# Patient Record
Sex: Female | Born: 1955 | Race: White | Hispanic: No | Marital: Married | Smoking: Never smoker
Health system: Southern US, Community
[De-identification: ages and names within clinical notes are randomized; demographics above are authoritative.]

## PROBLEM LIST (undated history)

## (undated) DIAGNOSIS — E119 Type 2 diabetes mellitus without complications: Secondary | ICD-10-CM

## (undated) DIAGNOSIS — Z973 Presence of spectacles and contact lenses: Secondary | ICD-10-CM

## (undated) DIAGNOSIS — C4490 Unspecified malignant neoplasm of skin, unspecified: Secondary | ICD-10-CM

## (undated) DIAGNOSIS — E7800 Pure hypercholesterolemia, unspecified: Secondary | ICD-10-CM

## (undated) DIAGNOSIS — I10 Essential (primary) hypertension: Secondary | ICD-10-CM

## (undated) DIAGNOSIS — M1990 Unspecified osteoarthritis, unspecified site: Secondary | ICD-10-CM

## (undated) HISTORY — DX: Unspecified malignant neoplasm of skin, unspecified: C44.90

## (undated) HISTORY — PX: BREAST BIOPSY: SHX20

## (undated) HISTORY — PX: ABDOMINAL HYSTERECTOMY: SHX81

## (undated) HISTORY — PX: LAPAROSCOPY: SHX197

## (undated) HISTORY — PX: WISDOM TOOTH EXTRACTION: SHX21

## (undated) SURGERY — Surgical Case
Anesthesia: *Unknown

---

## 1996-07-31 HISTORY — PX: FLEXIBLE SIGMOIDOSCOPY: SHX1649

## 2004-05-01 HISTORY — PX: APPENDECTOMY: SHX54

## 2004-09-16 ENCOUNTER — Ambulatory Visit: Payer: Self-pay | Admitting: Unknown Physician Specialty

## 2005-09-18 ENCOUNTER — Ambulatory Visit: Payer: Self-pay | Admitting: Unknown Physician Specialty

## 2006-03-15 ENCOUNTER — Other Ambulatory Visit: Payer: Self-pay

## 2006-03-30 ENCOUNTER — Inpatient Hospital Stay: Payer: Self-pay | Admitting: Unknown Physician Specialty

## 2006-07-02 ENCOUNTER — Ambulatory Visit: Payer: Self-pay | Admitting: Internal Medicine

## 2006-07-08 ENCOUNTER — Ambulatory Visit: Payer: Self-pay | Admitting: Internal Medicine

## 2006-09-02 ENCOUNTER — Ambulatory Visit: Payer: Self-pay | Admitting: Internal Medicine

## 2006-09-20 ENCOUNTER — Ambulatory Visit: Payer: Self-pay | Admitting: Unknown Physician Specialty

## 2007-09-27 ENCOUNTER — Ambulatory Visit: Payer: Self-pay | Admitting: Unknown Physician Specialty

## 2007-10-31 ENCOUNTER — Ambulatory Visit: Payer: Self-pay | Admitting: Unknown Physician Specialty

## 2007-11-28 ENCOUNTER — Ambulatory Visit: Payer: Self-pay | Admitting: General Surgery

## 2007-11-28 HISTORY — PX: COLONOSCOPY: SHX174

## 2008-01-30 ENCOUNTER — Inpatient Hospital Stay: Payer: Self-pay | Admitting: General Surgery

## 2008-01-30 HISTORY — PX: HERNIA REPAIR: SHX51

## 2008-01-30 HISTORY — PX: CHOLECYSTECTOMY: SHX55

## 2008-02-02 ENCOUNTER — Emergency Department: Payer: Self-pay | Admitting: Internal Medicine

## 2008-10-09 ENCOUNTER — Ambulatory Visit: Payer: Self-pay | Admitting: Unknown Physician Specialty

## 2009-10-10 ENCOUNTER — Ambulatory Visit: Payer: Self-pay | Admitting: Unknown Physician Specialty

## 2010-10-20 ENCOUNTER — Ambulatory Visit: Payer: Self-pay | Admitting: Unknown Physician Specialty

## 2011-11-04 ENCOUNTER — Ambulatory Visit: Payer: Self-pay | Admitting: Obstetrics and Gynecology

## 2011-11-18 ENCOUNTER — Ambulatory Visit: Payer: Self-pay | Admitting: Obstetrics and Gynecology

## 2014-12-19 ENCOUNTER — Ambulatory Visit: Payer: Self-pay

## 2015-12-27 ENCOUNTER — Other Ambulatory Visit: Payer: Self-pay | Admitting: Obstetrics & Gynecology

## 2015-12-27 DIAGNOSIS — Z1239 Encounter for other screening for malignant neoplasm of breast: Secondary | ICD-10-CM

## 2016-01-07 ENCOUNTER — Ambulatory Visit: Payer: Self-pay

## 2016-01-14 ENCOUNTER — Ambulatory Visit
Admission: RE | Admit: 2016-01-14 | Discharge: 2016-01-14 | Disposition: A | Discharge status: Home / Self Care | Payer: BC Managed Care – PPO | Source: Ambulatory Visit | Attending: Obstetrics & Gynecology | Admitting: Obstetrics & Gynecology

## 2016-01-14 ENCOUNTER — Other Ambulatory Visit: Payer: Self-pay | Admitting: Obstetrics & Gynecology

## 2016-01-14 DIAGNOSIS — Z1239 Encounter for other screening for malignant neoplasm of breast: Secondary | ICD-10-CM

## 2016-01-14 DIAGNOSIS — Z1231 Encounter for screening mammogram for malignant neoplasm of breast: Secondary | ICD-10-CM | POA: Diagnosis present

## 2016-10-15 ENCOUNTER — Encounter: Payer: Self-pay | Admitting: *Deleted

## 2016-10-21 ENCOUNTER — Ambulatory Visit
Admission: RE | Admit: 2016-10-21 | Discharge: 2016-10-21 | Disposition: A | Discharge status: Home / Self Care | Payer: BC Managed Care – PPO | Source: Ambulatory Visit | Attending: Podiatry | Admitting: Podiatry

## 2016-10-21 ENCOUNTER — Ambulatory Visit: Payer: BC Managed Care – PPO | Admitting: Anesthesiology

## 2016-10-21 ENCOUNTER — Encounter
Admission: RE | Disposition: A | Discharge status: Home / Self Care | Payer: Self-pay | Source: Ambulatory Visit | Attending: Podiatry

## 2016-10-21 DIAGNOSIS — Z79899 Other long term (current) drug therapy: Secondary | ICD-10-CM | POA: Diagnosis not present

## 2016-10-21 DIAGNOSIS — E119 Type 2 diabetes mellitus without complications: Secondary | ICD-10-CM | POA: Insufficient documentation

## 2016-10-21 DIAGNOSIS — Z7982 Long term (current) use of aspirin: Secondary | ICD-10-CM | POA: Insufficient documentation

## 2016-10-21 DIAGNOSIS — D489 Neoplasm of uncertain behavior, unspecified: Secondary | ICD-10-CM | POA: Diagnosis present

## 2016-10-21 DIAGNOSIS — Z7984 Long term (current) use of oral hypoglycemic drugs: Secondary | ICD-10-CM | POA: Diagnosis not present

## 2016-10-21 DIAGNOSIS — M7989 Other specified soft tissue disorders: Secondary | ICD-10-CM | POA: Insufficient documentation

## 2016-10-21 DIAGNOSIS — I10 Essential (primary) hypertension: Secondary | ICD-10-CM | POA: Insufficient documentation

## 2016-10-21 DIAGNOSIS — L03031 Cellulitis of right toe: Secondary | ICD-10-CM | POA: Diagnosis not present

## 2016-10-21 DIAGNOSIS — M898X7 Other specified disorders of bone, ankle and foot: Secondary | ICD-10-CM | POA: Diagnosis not present

## 2016-10-21 HISTORY — PX: MASS EXCISION: SHX2000

## 2016-10-21 HISTORY — DX: Presence of spectacles and contact lenses: Z97.3

## 2016-10-21 HISTORY — PX: EXCISION PARTIAL PHALANX: SHX6617

## 2016-10-21 HISTORY — DX: Essential (primary) hypertension: I10

## 2016-10-21 HISTORY — DX: Pure hypercholesterolemia, unspecified: E78.00

## 2016-10-21 HISTORY — DX: Unspecified osteoarthritis, unspecified site: M19.90

## 2016-10-21 HISTORY — DX: Type 2 diabetes mellitus without complications: E11.9

## 2016-10-21 LAB — GLUCOSE, CAPILLARY
Glucose-Capillary: 114 mg/dL — ABNORMAL HIGH (ref 65–99)
Glucose-Capillary: 95 mg/dL (ref 65–99)

## 2016-10-21 SURGERY — EXCISION MASS
Anesthesia: Monitor Anesthesia Care | Site: Foot | Laterality: Right | Wound class: Clean

## 2016-10-21 MED ORDER — OXYCODONE HCL 5 MG PO TABS: 5.0000 mg | ORAL_TABLET | Freq: Once | ORAL | Status: DC | PRN

## 2016-10-21 MED ORDER — ONDANSETRON HCL 4 MG PO TABS: 4.0000 mg | ORAL_TABLET | Freq: Four times a day (QID) | ORAL | Status: DC | PRN

## 2016-10-21 MED ORDER — OXYCODONE-ACETAMINOPHEN 5-325 MG PO TABS: 1.0000 | ORAL_TABLET | ORAL | 0 refills | Status: DC | PRN

## 2016-10-21 MED ORDER — ACETAMINOPHEN 325 MG PO TABS: 325.0000 mg | ORAL_TABLET | ORAL | Status: DC | PRN

## 2016-10-21 MED ORDER — BUPIVACAINE HCL (PF) 0.25 % IJ SOLN
INTRAMUSCULAR | Status: DC | PRN
  Administered 2016-10-21: 4 mL

## 2016-10-21 MED ORDER — DEXTROSE 5 % IV SOLN
2000.0000 mg | Freq: Once | INTRAVENOUS | Status: AC
  Administered 2016-10-21: 2000 mg via INTRAVENOUS

## 2016-10-21 MED ORDER — PROPOFOL 500 MG/50ML IV EMUL
INTRAVENOUS | Status: DC | PRN
  Administered 2016-10-21: 120 ug/kg/min via INTRAVENOUS

## 2016-10-21 MED ORDER — LACTATED RINGERS IV SOLN
INTRAVENOUS | Status: DC
  Administered 2016-10-21: 13:00:00 via INTRAVENOUS

## 2016-10-21 MED ORDER — ACETAMINOPHEN 160 MG/5ML PO SOLN
325.0000 mg | ORAL | Status: DC | PRN
Start: 2016-10-21 — End: 2016-10-21

## 2016-10-21 MED ORDER — FENTANYL CITRATE (PF) 100 MCG/2ML IJ SOLN: 25.0000 ug | INTRAMUSCULAR | Status: DC | PRN

## 2016-10-21 MED ORDER — PHENOL 89 % LIQD
Status: DC | PRN
  Administered 2016-10-21: 3 via TOPICAL

## 2016-10-21 MED ORDER — OXYCODONE HCL 5 MG/5ML PO SOLN: 5.0000 mg | Freq: Once | ORAL | Status: DC | PRN

## 2016-10-21 MED ORDER — ONDANSETRON HCL 4 MG/2ML IJ SOLN: 4.0000 mg | Freq: Once | INTRAMUSCULAR | Status: DC | PRN

## 2016-10-21 MED ORDER — MIDAZOLAM HCL 2 MG/2ML IJ SOLN
INTRAMUSCULAR | Status: DC | PRN
  Administered 2016-10-21: 2 mg via INTRAVENOUS

## 2016-10-21 MED ORDER — ONDANSETRON HCL 4 MG/2ML IJ SOLN: 4.0000 mg | Freq: Four times a day (QID) | INTRAMUSCULAR | Status: DC | PRN

## 2016-10-21 MED ORDER — LIDOCAINE HCL 1 % IJ SOLN
INTRAMUSCULAR | Status: DC | PRN
  Administered 2016-10-21: 4 mL

## 2016-10-21 MED ORDER — FENTANYL CITRATE (PF) 100 MCG/2ML IJ SOLN
INTRAMUSCULAR | Status: DC | PRN
Start: 2016-10-21 — End: 2016-10-21
  Administered 2016-10-21: 100 ug via INTRAVENOUS

## 2016-10-21 MED ORDER — OXYCODONE-ACETAMINOPHEN 5-325 MG PO TABS: 1.0000 | ORAL_TABLET | ORAL | Status: DC | PRN

## 2016-10-21 SURGICAL SUPPLY — 40 items
BENZOIN TINCTURE PRP APPL 2/3 (GAUZE/BANDAGES/DRESSINGS) ×2 IMPLANT
BLADE MINI RND TIP GREEN BEAV (BLADE) ×2 IMPLANT
BLADE SURG 15 STRL LF DISP TIS (BLADE) ×1 IMPLANT
BLADE SURG 15 STRL SS (BLADE) ×1
BNDG COHESIVE 4X5 TAN STRL (GAUZE/BANDAGES/DRESSINGS) ×4 IMPLANT
BNDG ESMARK 4X12 TAN STRL LF (GAUZE/BANDAGES/DRESSINGS) ×2 IMPLANT
BNDG GAUZE 4.5X4.1 6PLY STRL (MISCELLANEOUS) IMPLANT
BNDG STRETCH 4X75 STRL LF (GAUZE/BANDAGES/DRESSINGS) IMPLANT
CANISTER SUCT 1200ML W/VALVE (MISCELLANEOUS) ×2 IMPLANT
CUFF TOURN SGL QUICK 18 (TOURNIQUET CUFF) IMPLANT
DURAPREP 26ML APPLICATOR (WOUND CARE) ×2 IMPLANT
GAUZE PETRO XEROFOAM 1X8 (MISCELLANEOUS) ×2 IMPLANT
GAUZE PETRO XEROFOAM 5X9 (MISCELLANEOUS) IMPLANT
GAUZE SPONGE 4X4 12PLY STRL (GAUZE/BANDAGES/DRESSINGS) ×2 IMPLANT
GLOVE BIO SURGEON STRL SZ7.5 (GLOVE) ×2 IMPLANT
GLOVE INDICATOR 8.0 STRL GRN (GLOVE) ×2 IMPLANT
GOWN STRL REUS W/ TWL LRG LVL3 (GOWN DISPOSABLE) ×2 IMPLANT
GOWN STRL REUS W/TWL LRG LVL3 (GOWN DISPOSABLE) ×2
KIT ROOM TURNOVER OR (KITS) ×2 IMPLANT
NS IRRIG 500ML POUR BTL (IV SOLUTION) ×2 IMPLANT
PACK EXTREMITY ARMC (MISCELLANEOUS) ×2 IMPLANT
PAD GROUND ADULT SPLIT (MISCELLANEOUS) ×2 IMPLANT
STOCKINETTE IMPERVIOUS LG (DRAPES) ×2 IMPLANT
STRIP CLOSURE SKIN 1/4X4 (GAUZE/BANDAGES/DRESSINGS) IMPLANT
SUT ETHILON 4-0 (SUTURE) ×1
SUT ETHILON 4-0 FS2 18XMFL BLK (SUTURE) ×1
SUT ETHILON 5-0 FS-2 18 BLK (SUTURE) IMPLANT
SUT MNCRL 4-0 (SUTURE)
SUT MNCRL 4-0 27XMFL (SUTURE)
SUT MNCRL 5-0+ PC-1 (SUTURE) IMPLANT
SUT MONOCRYL 5-0 (SUTURE)
SUT VIC AB 0 CT1 36 (SUTURE) IMPLANT
SUT VIC AB 2-0 SH 27 (SUTURE)
SUT VIC AB 2-0 SH 27XBRD (SUTURE) IMPLANT
SUT VIC AB 3-0 SH 27 (SUTURE)
SUT VIC AB 3-0 SH 27X BRD (SUTURE) IMPLANT
SUT VIC AB 4-0 FS2 27 (SUTURE) ×2 IMPLANT
SUT VICRYL AB 3-0 FS1 BRD 27IN (SUTURE) IMPLANT
SUTURE ETHLN 4-0 FS2 18XMF BLK (SUTURE) ×1 IMPLANT
SUTURE MNCRL 4-0 27XMF (SUTURE) IMPLANT

## 2016-10-21 NOTE — Anesthesia Preprocedure Evaluation (Signed)
Anesthesia Evaluation  Patient identified by MRN, date of birth, ID band Patient awake    Reviewed: Allergy & Precautions, H&P , NPO status , Patient's Chart, lab work & pertinent test results  Airway Mallampati: II  TM Distance: >3 FB Neck ROM: full    Dental no notable dental hx.    Pulmonary    Pulmonary exam normal        Cardiovascular hypertension, Normal cardiovascular exam     Neuro/Psych    GI/Hepatic   Endo/Other  diabetes  Renal/GU      Musculoskeletal   Abdominal   Peds  Hematology   Anesthesia Other Findings   Reproductive/Obstetrics                             Anesthesia Physical Anesthesia Plan  ASA: II  Anesthesia Plan: MAC   Post-op Pain Management:    Induction:   Airway Management Planned:   Additional Equipment:   Intra-op Plan:   Post-operative Plan:   Informed Consent: I have reviewed the patients History and Physical, chart, labs and discussed the procedure including the risks, benefits and alternatives for the proposed anesthesia with the patient or authorized representative who has indicated his/her understanding and acceptance.     Plan Discussed with:   Anesthesia Plan Comments:         Anesthesia Quick Evaluation  

## 2016-10-21 NOTE — Discharge Instructions (Signed)
[facility] °[facility] ° °POST OPERATIVE INSTRUCTIONS FOR DR. TROXLER AND DR. FOWLER °[facility] ° ° °1. Take your medication as prescribed.  Pain medication should be taken only as needed. ° °2. Keep the dressing clean, dry and intact. ° °3. Keep your foot elevated above the heart level for the first 48 hours. ° °4. Walking to the bathroom and brief periods of walking are acceptable, unless we have instructed you to be non-weight bearing. ° °5. Always wear your post-op shoe when walking.  Always use your crutches if you are to be non-weight bearing. ° °6. Do not take a shower. Baths are permissible as long as the foot is kept out of the water.  ° °7. Every hour you are awake:  °- Bend your knee 15 times. °- Flex foot 15 times °- Massage calf 15 times ° °8. Call [facility] ([phone]) if any of the following problems occur: °- You develop a temperature or fever. °- The bandage becomes saturated with blood. °- Medication does not stop your pain. °- Injury of the foot occurs. °- Any symptoms of infection including redness, odor, or red streaks running from wound. ° ° °General Anesthesia, Adult, Care After °These instructions provide you with information about caring for yourself after your procedure. Your health care provider may also give you more specific instructions. Your treatment has been planned according to current medical practices, but problems sometimes occur. Call your health care provider if you have any problems or questions after your procedure. °What can I expect after the procedure? °After the procedure, it is common to have: °· Vomiting. °· A sore throat. °· Mental slowness. °It is common to feel: °· Nauseous. °· Cold or shivery. °· Sleepy. °· Tired. °· Sore or achy, even in parts of your body where you did not have surgery. °Follow these instructions at home: °For at least 24 hours after the procedure: °· Do not: °¨ Participate in  activities where you could fall or become injured. °¨ Drive. °¨ Use heavy machinery. °¨ Drink alcohol. °¨ Take sleeping pills or medicines that cause drowsiness. °¨ Make important decisions or sign legal documents. °¨ Take care of children on your own. °· Rest. °Eating and drinking °· If you vomit, drink water, juice, or soup when you can drink without vomiting. °· Drink enough fluid to keep your urine clear or pale yellow. °· Make sure you have little or no nausea before eating solid foods. °· Follow the diet recommended by your health care provider. °General instructions °· Have a responsible adult stay with you until you are awake and alert. °· Return to your normal activities as told by your health care provider. Ask your health care provider what activities are safe for you. °· Take over-the-counter and prescription medicines only as told by your health care provider. °· If you smoke, do not smoke without supervision. °· Keep all follow-up visits as told by your health care provider. This is important. °Contact a health care provider if: °· You continue to have nausea or vomiting at home, and medicines are not helpful. °· You cannot drink fluids or start eating again. °· You cannot urinate after 8-12 hours. °· You develop a skin rash. °· You have fever. °· You have increasing redness at the site of your procedure. °Get help right away if: °· You have difficulty breathing. °· You have chest pain. °· You have unexpected bleeding. °· You feel that you are having   a life-threatening or urgent problem. °This information is not intended to replace advice given to you by your health care provider. Make sure you discuss any questions you have with your health care provider. °Document Released: 01/25/2001 Document Revised: 03/23/2016 Document Reviewed: 10/03/2015 °Elsevier Interactive Patient Education © 2017 Elsevier Inc[employer] ° °

## 2016-10-21 NOTE — Anesthesia Procedure Notes (Signed)
Procedure Name: MAC Date/Time: 10/21/2016 12:44 PM Performed by: Janna Arch Pre-anesthesia Checklist: Patient identified, Emergency Drugs available, Suction available and Patient being monitored Patient Re-evaluated:Patient Re-evaluated prior to inductionOxygen Delivery Method: Simple face mask

## 2016-10-21 NOTE — Op Note (Signed)
Operative note   Surgeon:Mateus Rewerts    Assistant:none     Preop diagnosis:1.  Soft tissue mass and exostosis right 3rd toe  2.Paronychia right medial nail great toe    Postop diagnosis:Same    Procedure:1.Excision of sesamoid plantar right 3rd toe PIPJ  2. Permanent removal medial nail border right great toe    EBL: Minimal    Anesthesia:local and IV sedation    Hemostasis: Digital tourniquet to the right great toe for approximately 10 minutes    Specimen: Soft tissue and bone right third toe    Complications: None    Operative indications:Kathleen Butler is an 60 y.o. that presents today for surgical intervention.  The risks/benefits/alternatives/complications have been discussed and consent has been given. Patient has a noted prominence around the PIPJ of the plantar aspect of the right third toe. This was felt to be a possible soft tissue mass versus bone lesion. She'll ligament ingrowing on the right great toe medial nail border    Procedure:   Patient was brought into the OR and placed on the operating table in thesupine position. After anesthesia was obtained theright lower extremity was prepped and draped in usual sterile fashion.  Attention was directed to the plantar aspect of the right third toe where a longitudinal incision was made from the PIPJ to the MTPJ. Sharp and blunt dissection carried down to the long extensor tendon. There was not noted to be any obvious soft tissue mass though some mild hypertrophied thickened tissue just directly beneath the prominent region superficially. A small portion of this was removed and sent to the pathologist for evaluation. Further evaluation was then performed. The long extensor tendon was transected and tagged for later reanastomose. The brevis tendon were reflected medial and lateral. At this time at the plantar aspect of the PIPJ was a prominent bone consistent with a small sesamoid. This was then excised. The joint was explored  with no other bony prominence. The wound was flushed with copious amounts or irrigation. Layered closure was performed with 4-0 Vicryl the deeper subcutaneous tissue as well as for the long extensor tendon. A 4-0 nylon for the skin was applied. This was then covered sterilely.  Attention was then directed to the great toe where a small digital tourniquet was placed at the base of the great toe. The medial nail border was sharply removed. The proximal fold was then infiltrated with 3 30 second applications of phenol. The wound was then flushed with alcohol. A bulky sterile bandage was applied after antibiotic ointment.    Patient tolerated the procedure and anesthesia well.  Was transported from the OR to the PACU with all vital signs stable and vascular status intact. To be discharged per routine protocol.  Will follow up in approximately 1 week in the outpatient clinic.

## 2016-10-21 NOTE — H&P (Signed)
  HISTORY AND PHYSICAL INTERVAL NOTE:  10/21/2016  12:11 PM  Kathleen Butler  has presented today for surgery, with the diagnosis of [id number] NEOPLASM OF UNCERTAIN BEHAVIOR [id number].  The various methods of treatment have been discussed with the patient.  No guarantees were given.  After consideration of risks, benefits and other options for treatment, the patient has consented to surgery.  I have reviewed the patients' chart and labs.    Patient Vitals for the past 24 hrs:  BP Temp Temp src Pulse SpO2 Weight  10/21/16 1148 108/63 97.8 F (36.6 C) Temporal 64 97 % 84.8 kg (187 lb)    A history and physical examination was performed in my office.  The patient was reexamined.  There have been no changes to this history and physical examination. Excision of soft tissue mass and bone spur to toe, and removal of medial nail border great toe. Kathleen Butler A

## 2016-10-21 NOTE — Transfer of Care (Signed)
Immediate Anesthesia Transfer of Care Note  Patient: Kathleen Butler  Procedure(s) Performed: Procedure(s) with comments: EXCISION TUMOR SOFT TISSUE FOOT RIGHT 3RD TOE (Right) - IV WITH LOCAL EXCISION BONE PHALANX RIGHT 3RD TOE (Right) EXCISION OF NAIL MATRIX BED COMPLETE RIGHT GREAT TOE (Right) - Diabetic - oral meds  Patient Location: PACU  Anesthesia Type: MAC  Level of Consciousness: awake, alert  and patient cooperative  Airway and Oxygen Therapy: Patient Spontanous Breathing and Patient connected to supplemental oxygen  Post-op Assessment: Post-op Vital signs reviewed, Patient's Cardiovascular Status Stable, Respiratory Function Stable, Patent Airway and No signs of Nausea or vomiting  Post-op Vital Signs: Reviewed and stable  Complications: No apparent anesthesia complications

## 2016-10-21 NOTE — Anesthesia Postprocedure Evaluation (Signed)
Anesthesia Post Note  Patient: Kathleen Butler  Procedure(s) Performed: Procedure(s) (LRB): EXCISION TUMOR SOFT TISSUE FOOT RIGHT 3RD TOE (Right) EXCISION BONE PHALANX RIGHT 3RD TOE (Right) EXCISION OF NAIL MATRIX BED COMPLETE RIGHT GREAT TOE (Right)  Patient location during evaluation: PACU Anesthesia Type: MAC Level of consciousness: awake and alert and oriented Pain management: satisfactory to patient Vital Signs Assessment: post-procedure vital signs reviewed and stable Respiratory status: spontaneous breathing, nonlabored ventilation and respiratory function stable Cardiovascular status: blood pressure returned to baseline and stable Postop Assessment: Adequate PO intake and No signs of nausea or vomiting Anesthetic complications: no    Raliegh Ip

## 2016-10-22 ENCOUNTER — Encounter: Payer: Self-pay | Admitting: Podiatry

## 2016-10-23 LAB — SURGICAL PATHOLOGY

## 2016-11-02 DIAGNOSIS — C4490 Unspecified malignant neoplasm of skin, unspecified: Secondary | ICD-10-CM

## 2016-11-02 HISTORY — DX: Unspecified malignant neoplasm of skin, unspecified: C44.90

## 2017-01-13 ENCOUNTER — Other Ambulatory Visit: Payer: Self-pay | Admitting: Obstetrics & Gynecology

## 2017-01-13 DIAGNOSIS — N6310 Unspecified lump in the right breast, unspecified quadrant: Secondary | ICD-10-CM

## 2017-01-14 ENCOUNTER — Other Ambulatory Visit: Payer: Self-pay | Admitting: Obstetrics & Gynecology

## 2017-01-14 DIAGNOSIS — N6310 Unspecified lump in the right breast, unspecified quadrant: Secondary | ICD-10-CM

## 2017-01-15 ENCOUNTER — Other Ambulatory Visit: Payer: BC Managed Care – PPO

## 2017-01-19 ENCOUNTER — Ambulatory Visit
Admission: RE | Admit: 2017-01-19 | Discharge: 2017-01-19 | Disposition: A | Discharge status: Home / Self Care | Payer: BC Managed Care – PPO | Source: Ambulatory Visit | Attending: Obstetrics & Gynecology | Admitting: Obstetrics & Gynecology

## 2017-01-19 DIAGNOSIS — N6311 Unspecified lump in the right breast, upper outer quadrant: Secondary | ICD-10-CM | POA: Insufficient documentation

## 2017-01-19 DIAGNOSIS — N6310 Unspecified lump in the right breast, unspecified quadrant: Secondary | ICD-10-CM

## 2017-01-21 ENCOUNTER — Ambulatory Visit: Payer: BC Managed Care – PPO

## 2017-01-21 ENCOUNTER — Other Ambulatory Visit: Payer: BC Managed Care – PPO

## 2018-01-11 ENCOUNTER — Other Ambulatory Visit: Payer: Self-pay | Admitting: Obstetrics & Gynecology

## 2018-01-11 DIAGNOSIS — Z1231 Encounter for screening mammogram for malignant neoplasm of breast: Secondary | ICD-10-CM

## 2018-01-25 ENCOUNTER — Ambulatory Visit
Admission: RE | Admit: 2018-01-25 | Discharge: 2018-01-25 | Disposition: A | Discharge status: Home / Self Care | Payer: BC Managed Care – PPO | Source: Ambulatory Visit | Attending: Obstetrics & Gynecology | Admitting: Obstetrics & Gynecology

## 2018-01-25 DIAGNOSIS — Z1231 Encounter for screening mammogram for malignant neoplasm of breast: Secondary | ICD-10-CM | POA: Insufficient documentation

## 2018-03-08 ENCOUNTER — Encounter: Payer: Self-pay | Admitting: General Surgery

## 2018-04-05 ENCOUNTER — Encounter: Payer: Self-pay | Admitting: General Surgery

## 2018-05-10 ENCOUNTER — Encounter: Payer: Self-pay | Admitting: *Deleted

## 2018-05-12 ENCOUNTER — Ambulatory Visit: Payer: BC Managed Care – PPO | Admitting: General Surgery

## 2018-05-12 ENCOUNTER — Encounter: Payer: Self-pay | Admitting: General Surgery

## 2018-05-12 VITALS — BP 164/84 | HR 76 | Resp 13 | Ht 65.0 in | Wt 189.0 lb

## 2018-05-12 DIAGNOSIS — K432 Incisional hernia without obstruction or gangrene: Secondary | ICD-10-CM

## 2018-05-12 DIAGNOSIS — Z1211 Encounter for screening for malignant neoplasm of colon: Secondary | ICD-10-CM

## 2018-05-12 MED ORDER — POLYETHYLENE GLYCOL 3350 17 GM/SCOOP PO POWD: 1.0000 | Freq: Once | ORAL | 0 refills | Status: AC

## 2018-05-12 NOTE — Progress Notes (Signed)
Patient ID: Kathleen Butler, female   DOB: August 24, 1956, 62 y.o.   MRN: [id number]  Chief Complaint  Patient presents with  . Colonoscopy    HPI Kathleen Butler is a 62 y.o. female.  Who presents for a colonoscopy discussion referred by Dr Ouida Sills. The last colonoscopy was completed in 2009. Denies any gastrointestinal issues. Bowels move daily, regular and no bleeding noted. Loose BM today but she does admit to having trouble with greasy foods. She thinks she may have a hernia, she noticed this many years ago.  She also admits to possibly a bladder prolapse identified by her gynecologist. She has recently retired from [employer], [employer].  HPI  Past Medical History:  Diagnosis Date  . Arthritis    hands  . Diabetes mellitus without complication ([place])   . Hypercholesteremia   . Hypertension   . Skin cancer 2018   back  . Wears contact lenses     Past Surgical History:  Procedure Laterality Date  . ABDOMINAL HYSTERECTOMY  2007-2008  . APPENDECTOMY  05/01/2004   Ruptured appendix with extensive intra-abdominal abscess requiring open abscess drainage and appendectomy.  [name]    benign  . CHOLECYSTECTOMY  01/30/2008  . COLONOSCOPY  11/28/2007   Dr Bary Castilla  . EXCISION PARTIAL PHALANX Right 10/21/2016   Procedure: EXCISION BONE PHALANX RIGHT 3RD TOE;  Surgeon: [name], DPM;  Location: [facility];  Service: Podiatry;  Laterality: Right;  . FLEXIBLE SIGMOIDOSCOPY  07/31/1996  . HERNIA REPAIR  01/30/2008   Component separation repair of multiple abdominal defect secondary to ruptured appendix.  No mesh reinforcement.  Marland Kitchen LAPAROSCOPY     several - for endometriosis  . MASS EXCISION Right 10/21/2016   Procedure: EXCISION TUMOR SOFT TISSUE FOOT RIGHT 3RD TOE;  Surgeon: [name], DPM;  Location: [facility];  Service: Podiatry;  Laterality: Right;  IV WITH LOCAL    Family History  Problem Relation Age of Onset  . Breast cancer Mother 55  .  Diabetes Father     Social History Social History   Tobacco Use  . Smoking status: Never Smoker  . Smokeless tobacco: Never Used  Substance Use Topics  . Alcohol use: Yes    Comment: 1 drink /month  . Drug use: Never    Allergies  Allergen Reactions  . Macrobid [Nitrofurantoin]     Chills, fever    Current Outpatient Medications  Medication Sig Dispense Refill  . ASPIRIN 81 PO Take by mouth.    Marland Kitchen atorvastatin (LIPITOR) 40 MG tablet Take 40 mg by mouth daily.    . Calcium Carbonate-Vitamin D (CALTRATE 600+D PO) Take by mouth daily.    . Cholecalciferol (VITAMIN D3) 2000 units TABS Take by mouth daily.    Marland Kitchen lisinopril-hydrochlorothiazide (PRINZIDE,ZESTORETIC) 20-25 MG tablet Take 1 tablet by mouth daily.    . metFORMIN (GLUCOPHAGE) 500 MG tablet Take by mouth 2 (two) times daily with a meal.    . Multiple Vitamins-Minerals (ALIVE WOMENS 50+ PO) Take by mouth daily.     No current facility-administered medications for this visit.     Review of Systems Review of Systems  Constitutional: Negative.   Respiratory: Negative.   Cardiovascular: Negative.   Gastrointestinal: Negative for constipation and diarrhea.    Blood pressure (!) 164/84, pulse 76, resp. rate 13, height 5\' 5"  (1.651 m), weight 189 lb (85.7 kg).  Physical Exam Physical Exam  Constitutional: She is oriented to person, place, and time. She appears  well-developed and well-nourished.  HENT:  Mouth/Throat: Oropharynx is clear and moist.  Eyes: Conjunctivae are normal. No scleral icterus.  Neck: Neck supple.  Cardiovascular: Normal rate, regular rhythm and normal heart sounds.  Pulmonary/Chest: Effort normal and breath sounds normal.  Abdominal: Soft. Bowel sounds are normal. A hernia is present. Hernia confirmed positive in the ventral area.    Ventral hernia just above her shallow umbilicus  Lymphadenopathy:    She has no cervical adenopathy.  Neurological: She is alert and oriented to person, place,  and time.  Skin: Skin is warm and dry.  Psychiatric: Her behavior is normal.    Data Reviewed November 28, 2007 colonoscopy reviewed: Diverticulosis.  Comprehensive metabolic panel dated Mar 29, 2018 showed a blood sugar of 167.  Normal electrolytes.  Creatinine 0.7 with an estimated GFR of 85.  Hemoglobin A1c of the same date: 7.9.  Assessment    Recurrent midline fascial defect post component separation without mesh reinforcement 10 years ago.  Asymptomatic.  Candidate for screening colonoscopy.    Plan    Hold metformin day of prep and procedure.  Colonoscopy with possible biopsy/polypectomy prn: Information regarding the procedure, including its potential risks and complications (including but not limited to perforation of the bowel, which may require emergency surgery to repair, and bleeding) was verbally given to the patient. Educational information regarding lower intestinal endoscopy was given to the patient. Written instructions for how to complete the bowel prep using Miralax were provided. The importance of drinking ample fluids to avoid dehydration as a result of the prep emphasized.    HPI, Physical Exam, Assessment and Plan have been scribed under the direction and in the presence of Robert Bellow, MD. Karie Fetch, RN  I have completed the exam and reviewed the above documentation for accuracy and completeness.  I agree with the above.  Haematologist has been used and any errors in dictation or transcription are unintentional.  Hervey Ard, M.D., F.A.C.S.  The patient is scheduled for a Colonoscopy at [facility] on 05/25/18. They are aware to call the day before to get their arrival time. She will hold her Metformin the day of prep and procedure. Miralax prescription has been sent into the patient's pharmacy. The patient is aware of date and instructions.  Documented by Caryl-Lyn Otis Brace LPN   Forest Gleason Byrnett 05/13/2018, 6:36 AM

## 2018-05-12 NOTE — Patient Instructions (Addendum)
The patient is aware to call back for any questions or concerns.  Hold metformin day of prep and procedure.  Colonoscopy, Adult A colonoscopy is an exam to look at the entire large intestine. During the exam, a lubricated, bendable tube is inserted into the anus and then passed into the rectum, colon, and other parts of the large intestine. A colonoscopy is often done as a part of normal colorectal screening or in response to certain symptoms, such as anemia, persistent diarrhea, abdominal pain, and blood in the stool. The exam can help screen for and diagnose medical problems, including:  Tumors.  Polyps.  Inflammation.  Areas of bleeding.  Tell a health care provider about:  Any allergies you have.  All medicines you are taking, including vitamins, herbs, eye drops, creams, and over-the-counter medicines.  Any problems you or family members have had with anesthetic medicines.  Any blood disorders you have.  Any surgeries you have had.  Any medical conditions you have.  Any problems you have had passing stool. What are the risks? Generally, this is a safe procedure. However, problems may occur, including:  Bleeding.  A tear in the intestine.  A reaction to medicines given during the exam.  Infection (rare).  What happens before the procedure? Eating and drinking restrictions Follow instructions from your health care provider about eating and drinking, which may include:  A few days before the procedure - follow a low-fiber diet. Avoid nuts, seeds, dried fruit, raw fruits, and vegetables.  1-3 days before the procedure - follow a clear liquid diet. Drink only clear liquids, such as clear broth or bouillon, black coffee or tea, clear juice, clear soft drinks or sports drinks, gelatin dessert, and popsicles. Avoid any liquids that contain red or purple dye.  On the day of the procedure - do not eat or drink anything during the 2 hours before the procedure, or within  the time period that your health care provider recommends.  Bowel prep If you were prescribed an oral bowel prep to clean out your colon:  Take it as told by your health care provider. Starting the day before your procedure, you will need to drink a large amount of medicated liquid. The liquid will cause you to have multiple loose stools until your stool is almost clear or light green.  If your skin or anus gets irritated from diarrhea, you may use these to relieve the irritation: ? Medicated wipes, such as adult wet wipes with aloe and vitamin E. ? A skin soothing-product like petroleum jelly.  If you vomit while drinking the bowel prep, take a break for up to 60 minutes and then begin the bowel prep again. If vomiting continues and you cannot take the bowel prep without vomiting, call your health care provider.  General instructions  Ask your health care provider about changing or stopping your regular medicines. This is especially important if you are taking diabetes medicines or blood thinners.  Plan to have someone take you home from the hospital or clinic. What happens during the procedure?  An IV tube may be inserted into one of your veins.  You will be given medicine to help you relax (sedative).  To reduce your risk of infection: ? Your health care team will wash or sanitize their hands. ? Your anal area will be washed with soap.  You will be asked to lie on your side with your knees bent.  Your health care provider will lubricate a long, thin, flexible  tube. The tube will have a camera and a light on the end.  The tube will be inserted into your anus.  The tube will be gently eased through your rectum and colon.  Air will be delivered into your colon to keep it open. You may feel some pressure or cramping.  The camera will be used to take images during the procedure.  A small tissue sample may be removed from your body to be examined under a microscope (biopsy). If  any potential problems are found, the tissue will be sent to a lab for testing.  If small polyps are found, your health care provider may remove them and have them checked for cancer cells.  The tube that was inserted into your anus will be slowly removed. The procedure may vary among health care providers and hospitals. What happens after the procedure?  Your blood pressure, heart rate, breathing rate, and blood oxygen level will be monitored until the medicines you were given have worn off.  Do not drive for 24 hours after the exam.  You may have a small amount of blood in your stool.  You may pass gas and have mild abdominal cramping or bloating due to the air that was used to inflate your colon during the exam.  It is up to you to get the results of your procedure. Ask your health care provider, or the department performing the procedure, when your results will be ready. This information is not intended to replace advice given to you by your health care provider. Make sure you discuss any questions you have with your health care provider. Document Released: 10/16/2000 Document Revised: 08/19/2016 Document Reviewed: 12/31/2015 Elsevier Interactive Patient Education  [employer].   The patient is scheduled for a Colonoscopy at [facility] on 05/25/18. They are aware to call the day before to get their arrival time. She will hold her Metformin the day of prep and procedure. Miralax prescription has been sent into the patient's pharmacy. The patient is aware of date and instructions.

## 2018-05-13 ENCOUNTER — Encounter: Payer: Self-pay | Admitting: General Surgery

## 2018-05-13 DIAGNOSIS — Z1211 Encounter for screening for malignant neoplasm of colon: Secondary | ICD-10-CM | POA: Insufficient documentation

## 2018-05-13 DIAGNOSIS — K432 Incisional hernia without obstruction or gangrene: Secondary | ICD-10-CM | POA: Insufficient documentation

## 2018-05-24 ENCOUNTER — Encounter: Payer: Self-pay | Admitting: Emergency Medicine

## 2018-05-25 ENCOUNTER — Ambulatory Visit
Admission: RE | Admit: 2018-05-25 | Discharge: 2018-05-25 | Disposition: A | Discharge status: Home / Self Care | Payer: BC Managed Care – PPO | Source: Ambulatory Visit | Attending: General Surgery | Admitting: General Surgery

## 2018-05-25 ENCOUNTER — Ambulatory Visit: Payer: BC Managed Care – PPO | Admitting: Registered Nurse

## 2018-05-25 ENCOUNTER — Encounter
Admission: RE | Disposition: A | Discharge status: Home / Self Care | Payer: Self-pay | Source: Ambulatory Visit | Attending: General Surgery

## 2018-05-25 DIAGNOSIS — K5730 Diverticulosis of large intestine without perforation or abscess without bleeding: Secondary | ICD-10-CM | POA: Insufficient documentation

## 2018-05-25 DIAGNOSIS — Z85828 Personal history of other malignant neoplasm of skin: Secondary | ICD-10-CM | POA: Insufficient documentation

## 2018-05-25 DIAGNOSIS — Z833 Family history of diabetes mellitus: Secondary | ICD-10-CM | POA: Diagnosis not present

## 2018-05-25 DIAGNOSIS — E119 Type 2 diabetes mellitus without complications: Secondary | ICD-10-CM | POA: Diagnosis not present

## 2018-05-25 DIAGNOSIS — Z90710 Acquired absence of both cervix and uterus: Secondary | ICD-10-CM | POA: Diagnosis not present

## 2018-05-25 DIAGNOSIS — Z7982 Long term (current) use of aspirin: Secondary | ICD-10-CM | POA: Insufficient documentation

## 2018-05-25 DIAGNOSIS — I10 Essential (primary) hypertension: Secondary | ICD-10-CM | POA: Insufficient documentation

## 2018-05-25 DIAGNOSIS — Z79899 Other long term (current) drug therapy: Secondary | ICD-10-CM | POA: Diagnosis not present

## 2018-05-25 DIAGNOSIS — Z1211 Encounter for screening for malignant neoplasm of colon: Secondary | ICD-10-CM | POA: Diagnosis present

## 2018-05-25 DIAGNOSIS — M19042 Primary osteoarthritis, left hand: Secondary | ICD-10-CM | POA: Insufficient documentation

## 2018-05-25 DIAGNOSIS — M19041 Primary osteoarthritis, right hand: Secondary | ICD-10-CM | POA: Diagnosis not present

## 2018-05-25 DIAGNOSIS — Z7984 Long term (current) use of oral hypoglycemic drugs: Secondary | ICD-10-CM | POA: Insufficient documentation

## 2018-05-25 DIAGNOSIS — Z881 Allergy status to other antibiotic agents status: Secondary | ICD-10-CM | POA: Insufficient documentation

## 2018-05-25 DIAGNOSIS — E7800 Pure hypercholesterolemia, unspecified: Secondary | ICD-10-CM | POA: Insufficient documentation

## 2018-05-25 DIAGNOSIS — Z9049 Acquired absence of other specified parts of digestive tract: Secondary | ICD-10-CM | POA: Diagnosis not present

## 2018-05-25 DIAGNOSIS — Z803 Family history of malignant neoplasm of breast: Secondary | ICD-10-CM | POA: Insufficient documentation

## 2018-05-25 HISTORY — PX: COLONOSCOPY WITH PROPOFOL: SHX5780

## 2018-05-25 LAB — GLUCOSE, CAPILLARY: GLUCOSE-CAPILLARY: 161 mg/dL — AB (ref 70–99)

## 2018-05-25 SURGERY — COLONOSCOPY WITH PROPOFOL
Anesthesia: General

## 2018-05-25 MED ORDER — EPHEDRINE SULFATE 50 MG/ML IJ SOLN
INTRAMUSCULAR | Status: AC
  Filled 2018-05-25: qty 1

## 2018-05-25 MED ORDER — PROPOFOL 500 MG/50ML IV EMUL
INTRAVENOUS | Status: AC
  Filled 2018-05-25: qty 50

## 2018-05-25 MED ORDER — LIDOCAINE HCL (PF) 1 % IJ SOLN
2.0000 mL | Freq: Once | INTRAMUSCULAR | Status: AC
  Administered 2018-05-25: 0.3 mL via INTRADERMAL

## 2018-05-25 MED ORDER — PROPOFOL 10 MG/ML IV BOLUS
INTRAVENOUS | Status: AC
  Filled 2018-05-25: qty 20

## 2018-05-25 MED ORDER — PROPOFOL 500 MG/50ML IV EMUL
INTRAVENOUS | Status: DC | PRN
  Administered 2018-05-25: 140 ug/kg/min via INTRAVENOUS

## 2018-05-25 MED ORDER — LIDOCAINE HCL (PF) 1 % IJ SOLN
INTRAMUSCULAR | Status: AC
  Administered 2018-05-25: 0.3 mL via INTRADERMAL
  Filled 2018-05-25: qty 2

## 2018-05-25 MED ORDER — PROPOFOL 500 MG/50ML IV EMUL
INTRAVENOUS | Status: AC
  Filled 2018-05-25: qty 100

## 2018-05-25 MED ORDER — SODIUM CHLORIDE 0.9 % IV SOLN
INTRAVENOUS | Status: DC
  Administered 2018-05-25: 1000 mL via INTRAVENOUS

## 2018-05-25 MED ORDER — LIDOCAINE HCL (PF) 2 % IJ SOLN
INTRAMUSCULAR | Status: AC
  Filled 2018-05-25: qty 10

## 2018-05-25 MED ORDER — LIDOCAINE HCL (CARDIAC) PF 100 MG/5ML IV SOSY
PREFILLED_SYRINGE | INTRAVENOUS | Status: DC | PRN
  Administered 2018-05-25: 40 mg via INTRAVENOUS

## 2018-05-25 MED ORDER — PHENYLEPHRINE HCL 10 MG/ML IJ SOLN
INTRAMUSCULAR | Status: AC
  Filled 2018-05-25: qty 1

## 2018-05-25 MED ORDER — PROPOFOL 10 MG/ML IV BOLUS
INTRAVENOUS | Status: DC | PRN
  Administered 2018-05-25: 70 mg via INTRAVENOUS
  Administered 2018-05-25: 30 mg via INTRAVENOUS

## 2018-05-25 NOTE — Anesthesia Post-op Follow-up Note (Signed)
Anesthesia QCDR form completed.        

## 2018-05-25 NOTE — Op Note (Signed)
[facility] Gastroenterology Patient Name: Kathleen Butler Procedure Date: 05/25/2018 7:56 AM MRN: [id number] Account #: [id number] Date of Birth: 1956/10/05 Admit Type: Outpatient Age: 62 Room: [facility] ENDO ROOM 1 Gender: Female Note Status: Finalized Procedure:            Colonoscopy Indications:          Screening for colorectal malignant neoplasm Providers:            Robert Bellow, MD Referring MD:         Ocie Cornfield. Ouida Sills MD, MD (Referring MD) Medicines:            Monitored Anesthesia Care Complications:        No immediate complications. Procedure:            Pre-Anesthesia Assessment:                       - Prior to the procedure, a History and Physical was                        performed, and patient medications, allergies and                        sensitivities were reviewed. The patient's tolerance of                        previous anesthesia was reviewed.                       - The risks and benefits of the procedure and the                        sedation options and risks were discussed with the                        patient. All questions were answered and informed                        consent was obtained.                       After obtaining informed consent, the colonoscope was                        passed under direct vision. Throughout the procedure,                        the patient's blood pressure, pulse, and oxygen                        saturations were monitored continuously. The                        Colonoscope was introduced through the anus and                        advanced to the the cecum, identified by appendiceal                        orifice and ileocecal valve. The colonoscopy was  performed without difficulty. The patient tolerated the                        procedure well. The quality of the bowel preparation                        was excellent. Findings:      A few medium-mouthed  diverticula were found in the sigmoid colon.      The retroflexed view of the distal rectum and anal verge was normal and       showed no anal or rectal abnormalities. Impression:           - Diverticulosis in the sigmoid colon.                       - The distal rectum and anal verge are normal on                        retroflexion view.                       - No specimens collected. Recommendation:       - Repeat colonoscopy in 10 years for screening purposes. Robert Bellow, MD 05/25/2018 8:31:40 AM This report has been signed electronically. Number of Addenda: 0 Note Initiated On: 05/25/2018 7:56 AM Scope Withdrawal Time: 0 hours 8 minutes 3 seconds  Total Procedure Duration: 0 hours 15 minutes 35 seconds       [facility]

## 2018-05-25 NOTE — Anesthesia Preprocedure Evaluation (Addendum)
Anesthesia Evaluation  Patient identified by MRN, date of birth, ID band Patient awake    Reviewed: Allergy & Precautions, H&P , NPO status , reviewed documented beta blocker date and time   Airway Mallampati: II  TM Distance: >3 FB Neck ROM: full    Dental  (+) Teeth Intact   Pulmonary    Pulmonary exam normal        Cardiovascular hypertension, Normal cardiovascular exam     Neuro/Psych    GI/Hepatic neg GERD  ,  Endo/Other  diabetes  Renal/GU      Musculoskeletal  (+) Arthritis ,   Abdominal   Peds  Hematology   Anesthesia Other Findings Past Medical History: No date: Arthritis     Comment:  hands No date: Diabetes mellitus without complication ([place]) No date: Hypercholesteremia No date: Hypertension 2018: Skin cancer     Comment:  back No date: Wears contact lenses  Past Surgical History: 2007-2008: ABDOMINAL HYSTERECTOMY 05/01/2004: APPENDECTOMY     Comment:  Ruptured appendix with extensive intra-abdominal abscess              requiring open abscess drainage and appendectomy. No date: BREAST BIOPSY; Left     Comment:  benign 01/30/2008: CHOLECYSTECTOMY 11/28/2007: COLONOSCOPY     Comment:  Dr Bary Castilla 10/21/2016: EXCISION PARTIAL PHALANX; Right     Comment:  Procedure: EXCISION BONE PHALANX RIGHT 3RD TOE;                Surgeon: [name], DPM;  Location: [place];  Service: Podiatry;  Laterality: Right; 07/31/1996: FLEXIBLE SIGMOIDOSCOPY 01/30/2008: HERNIA REPAIR     Comment:  Component separation repair of multiple abdominal defect              secondary to ruptured appendix.  No mesh reinforcement. No date: LAPAROSCOPY     Comment:  several - for endometriosis 10/21/2016: MASS EXCISION; Right     Comment:  Procedure: EXCISION TUMOR SOFT TISSUE FOOT RIGHT 3RD               TOE;  Surgeon: [name], DPM;  Location: [place];  Service:  Podiatry;  Laterality: Right;  IV              WITH LOCAL     Reproductive/Obstetrics                            Anesthesia Physical Anesthesia Plan  ASA: II  Anesthesia Plan: General   Post-op Pain Management:    Induction:   PONV Risk Score and Plan: 3 and Treatment may vary due to age or medical condition and TIVA  Airway Management Planned:   Additional Equipment:   Intra-op Plan:   Post-operative Plan:   Informed Consent: I have reviewed the patients History and Physical, chart, labs and discussed the procedure including the risks, benefits and alternatives for the proposed anesthesia with the patient or authorized representative who has indicated his/her understanding and acceptance.   Dental Advisory Given  Plan Discussed with: CRNA  Anesthesia Plan Comments:        Anesthesia Quick Evaluation

## 2018-05-25 NOTE — Transfer of Care (Signed)
Immediate Anesthesia Transfer of Care Note  Patient: Kathleen Butler  Procedure(s) Performed: COLONOSCOPY WITH PROPOFOL (N/A )  Patient Location: PACU  Anesthesia Type:General  Level of Consciousness: sedated  Airway & Oxygen Therapy: Patient Spontanous Breathing and Patient connected to nasal cannula oxygen  Post-op Assessment: Report given to RN and Post -op Vital signs reviewed and stable  Post vital signs: Reviewed and stable  Last Vitals:  Vitals Value Taken Time  BP 113/62 05/25/2018  8:33 AM  Temp 36.1 C 05/25/2018  8:33 AM  Pulse 70 05/25/2018  8:33 AM  Resp 16 05/25/2018  8:33 AM  SpO2 96 % 05/25/2018  8:33 AM    Last Pain:  Vitals:   05/25/18 0832  TempSrc: (P) Tympanic  PainSc:          Complications: No apparent anesthesia complications

## 2018-05-25 NOTE — H&P (Signed)
No change in clinical exam or history. For screening colonoscopy.

## 2018-05-25 NOTE — Anesthesia Postprocedure Evaluation (Signed)
Anesthesia Post Note  Patient: Kathleen Butler  Procedure(s) Performed: COLONOSCOPY WITH PROPOFOL (N/A )  Patient location during evaluation: Endoscopy Anesthesia Type: General Level of consciousness: awake and alert Pain management: pain level controlled Vital Signs Assessment: post-procedure vital signs reviewed and stable Respiratory status: spontaneous breathing, nonlabored ventilation and respiratory function stable Cardiovascular status: blood pressure returned to baseline and stable Postop Assessment: no apparent nausea or vomiting Anesthetic complications: no     Last Vitals:  Vitals:   05/25/18 0832 05/25/18 0833  BP:  113/62  Pulse:  70  Resp:  16  Temp: (!) 36.1 C (!) 36.1 C  SpO2:  96%    Last Pain:  Vitals:   05/25/18 0832  TempSrc: Tympanic  PainSc:                  Alphonsus Sias

## 2018-05-26 ENCOUNTER — Encounter: Payer: Self-pay | Admitting: General Surgery

## 2019-01-18 ENCOUNTER — Other Ambulatory Visit: Payer: Self-pay | Admitting: Obstetrics & Gynecology

## 2019-01-18 DIAGNOSIS — Z1231 Encounter for screening mammogram for malignant neoplasm of breast: Secondary | ICD-10-CM

## 2019-05-04 ENCOUNTER — Ambulatory Visit
Admission: RE | Admit: 2019-05-04 | Discharge: 2019-05-04 | Disposition: A | Discharge status: Home / Self Care | Payer: BC Managed Care – PPO | Source: Ambulatory Visit | Attending: Obstetrics & Gynecology | Admitting: Obstetrics & Gynecology

## 2019-05-04 DIAGNOSIS — Z1231 Encounter for screening mammogram for malignant neoplasm of breast: Secondary | ICD-10-CM | POA: Diagnosis not present

## 2020-01-23 ENCOUNTER — Other Ambulatory Visit: Payer: Self-pay | Admitting: Obstetrics & Gynecology

## 2020-01-23 DIAGNOSIS — Z1231 Encounter for screening mammogram for malignant neoplasm of breast: Secondary | ICD-10-CM

## 2020-05-07 ENCOUNTER — Ambulatory Visit
Admission: RE | Admit: 2020-05-07 | Discharge: 2020-05-07 | Disposition: A | Discharge status: Home / Self Care | Payer: BC Managed Care – PPO | Source: Ambulatory Visit | Attending: Obstetrics & Gynecology | Admitting: Obstetrics & Gynecology

## 2020-05-07 DIAGNOSIS — Z1231 Encounter for screening mammogram for malignant neoplasm of breast: Secondary | ICD-10-CM | POA: Diagnosis not present

## 2020-05-28 ENCOUNTER — Other Ambulatory Visit: Payer: Self-pay

## 2020-05-28 ENCOUNTER — Other Ambulatory Visit
Admission: RE | Admit: 2020-05-28 | Discharge: 2020-05-28 | Disposition: A | Discharge status: Home / Self Care | Payer: BC Managed Care – PPO | Source: Ambulatory Visit | Attending: Cardiology | Admitting: Cardiology

## 2020-05-28 DIAGNOSIS — Z01812 Encounter for preprocedural laboratory examination: Secondary | ICD-10-CM | POA: Diagnosis present

## 2020-05-28 DIAGNOSIS — Z20822 Contact with and (suspected) exposure to covid-19: Secondary | ICD-10-CM | POA: Diagnosis not present

## 2020-05-28 LAB — SARS CORONAVIRUS 2 (TAT 6-24 HRS): SARS Coronavirus 2: NEGATIVE

## 2020-05-30 ENCOUNTER — Ambulatory Visit
Admission: RE | Admit: 2020-05-30 | Discharge: 2020-05-30 | Disposition: A | Discharge status: Home / Self Care | Payer: BC Managed Care – PPO | Attending: Cardiology | Admitting: Cardiology

## 2020-05-30 ENCOUNTER — Encounter
Admission: RE | Disposition: A | Discharge status: Home / Self Care | Payer: Self-pay | Source: Home / Self Care | Attending: Cardiology

## 2020-05-30 ENCOUNTER — Encounter: Payer: Self-pay | Admitting: Cardiology

## 2020-05-30 ENCOUNTER — Other Ambulatory Visit: Payer: Self-pay

## 2020-05-30 DIAGNOSIS — R42 Dizziness and giddiness: Secondary | ICD-10-CM | POA: Insufficient documentation

## 2020-05-30 DIAGNOSIS — R0602 Shortness of breath: Secondary | ICD-10-CM | POA: Insufficient documentation

## 2020-05-30 DIAGNOSIS — K5790 Diverticulosis of intestine, part unspecified, without perforation or abscess without bleeding: Secondary | ICD-10-CM | POA: Diagnosis not present

## 2020-05-30 DIAGNOSIS — E669 Obesity, unspecified: Secondary | ICD-10-CM | POA: Insufficient documentation

## 2020-05-30 DIAGNOSIS — K589 Irritable bowel syndrome without diarrhea: Secondary | ICD-10-CM | POA: Diagnosis not present

## 2020-05-30 DIAGNOSIS — Z7982 Long term (current) use of aspirin: Secondary | ICD-10-CM | POA: Insufficient documentation

## 2020-05-30 DIAGNOSIS — R9439 Abnormal result of other cardiovascular function study: Secondary | ICD-10-CM | POA: Diagnosis present

## 2020-05-30 DIAGNOSIS — I10 Essential (primary) hypertension: Secondary | ICD-10-CM | POA: Diagnosis not present

## 2020-05-30 DIAGNOSIS — E119 Type 2 diabetes mellitus without complications: Secondary | ICD-10-CM | POA: Diagnosis not present

## 2020-05-30 DIAGNOSIS — Z7984 Long term (current) use of oral hypoglycemic drugs: Secondary | ICD-10-CM | POA: Insufficient documentation

## 2020-05-30 DIAGNOSIS — E785 Hyperlipidemia, unspecified: Secondary | ICD-10-CM | POA: Insufficient documentation

## 2020-05-30 DIAGNOSIS — R079 Chest pain, unspecified: Secondary | ICD-10-CM | POA: Diagnosis not present

## 2020-05-30 DIAGNOSIS — R5383 Other fatigue: Secondary | ICD-10-CM | POA: Insufficient documentation

## 2020-05-30 DIAGNOSIS — Z79899 Other long term (current) drug therapy: Secondary | ICD-10-CM | POA: Diagnosis not present

## 2020-05-30 DIAGNOSIS — K219 Gastro-esophageal reflux disease without esophagitis: Secondary | ICD-10-CM | POA: Diagnosis not present

## 2020-05-30 DIAGNOSIS — Z6829 Body mass index (BMI) 29.0-29.9, adult: Secondary | ICD-10-CM | POA: Diagnosis not present

## 2020-05-30 HISTORY — PX: LEFT HEART CATH AND CORONARY ANGIOGRAPHY: CATH118249

## 2020-05-30 SURGERY — LEFT HEART CATH AND CORONARY ANGIOGRAPHY
Anesthesia: Moderate Sedation | Laterality: Left

## 2020-05-30 MED ORDER — SODIUM CHLORIDE 0.9 % WEIGHT BASED INFUSION: 1.0000 mL/kg/h | INTRAVENOUS | Status: DC

## 2020-05-30 MED ORDER — LABETALOL HCL 5 MG/ML IV SOLN: 10.0000 mg | INTRAVENOUS | Status: DC | PRN

## 2020-05-30 MED ORDER — HEPARIN (PORCINE) IN NACL 1000-0.9 UT/500ML-% IV SOLN
INTRAVENOUS | Status: AC
  Filled 2020-05-30: qty 1000

## 2020-05-30 MED ORDER — MIDAZOLAM HCL 2 MG/2ML IJ SOLN
INTRAMUSCULAR | Status: DC | PRN
  Administered 2020-05-30: 1 mg via INTRAVENOUS

## 2020-05-30 MED ORDER — HYDRALAZINE HCL 20 MG/ML IJ SOLN: 10.0000 mg | INTRAMUSCULAR | Status: DC | PRN

## 2020-05-30 MED ORDER — SODIUM CHLORIDE 0.9 % WEIGHT BASED INFUSION
3.0000 mL/kg/h | INTRAVENOUS | Status: AC
  Administered 2020-05-30: 3 mL/kg/h via INTRAVENOUS

## 2020-05-30 MED ORDER — ONDANSETRON HCL 4 MG/2ML IJ SOLN: 4.0000 mg | Freq: Four times a day (QID) | INTRAMUSCULAR | Status: DC | PRN

## 2020-05-30 MED ORDER — HEPARIN SODIUM (PORCINE) 1000 UNIT/ML IJ SOLN
INTRAMUSCULAR | Status: AC
  Filled 2020-05-30: qty 1

## 2020-05-30 MED ORDER — HEPARIN SODIUM (PORCINE) 1000 UNIT/ML IJ SOLN
INTRAMUSCULAR | Status: DC | PRN
  Administered 2020-05-30: 4000 [IU] via INTRAVENOUS

## 2020-05-30 MED ORDER — SODIUM CHLORIDE 0.9 % IV SOLN: 250.0000 mL | INTRAVENOUS | Status: DC | PRN

## 2020-05-30 MED ORDER — SODIUM CHLORIDE 0.9% FLUSH: 3.0000 mL | Freq: Two times a day (BID) | INTRAVENOUS | Status: DC

## 2020-05-30 MED ORDER — HEPARIN (PORCINE) IN NACL 1000-0.9 UT/500ML-% IV SOLN
INTRAVENOUS | Status: DC | PRN
  Administered 2020-05-30: 500 mL

## 2020-05-30 MED ORDER — ASPIRIN 81 MG PO CHEW: 81.0000 mg | CHEWABLE_TABLET | ORAL | Status: DC

## 2020-05-30 MED ORDER — FENTANYL CITRATE (PF) 100 MCG/2ML IJ SOLN
INTRAMUSCULAR | Status: AC
  Filled 2020-05-30: qty 2

## 2020-05-30 MED ORDER — IOHEXOL 300 MG/ML  SOLN
INTRAMUSCULAR | Status: DC | PRN
  Administered 2020-05-30: 45 mL

## 2020-05-30 MED ORDER — MIDAZOLAM HCL 2 MG/2ML IJ SOLN
INTRAMUSCULAR | Status: AC
  Filled 2020-05-30: qty 2

## 2020-05-30 MED ORDER — SODIUM CHLORIDE 0.9% FLUSH: 3.0000 mL | INTRAVENOUS | Status: DC | PRN

## 2020-05-30 MED ORDER — VERAPAMIL HCL 2.5 MG/ML IV SOLN
INTRAVENOUS | Status: DC | PRN
  Administered 2020-05-30: 2.5 mg via INTRA_ARTERIAL

## 2020-05-30 MED ORDER — VERAPAMIL HCL 2.5 MG/ML IV SOLN
INTRAVENOUS | Status: AC
  Filled 2020-05-30: qty 2

## 2020-05-30 MED ORDER — FENTANYL CITRATE (PF) 100 MCG/2ML IJ SOLN
INTRAMUSCULAR | Status: DC | PRN
  Administered 2020-05-30: 25 ug via INTRAVENOUS

## 2020-05-30 MED ORDER — ACETAMINOPHEN 325 MG PO TABS: 650.0000 mg | ORAL_TABLET | ORAL | Status: DC | PRN

## 2020-05-30 SURGICAL SUPPLY — 7 items
CATH 5F 110X4 TIG (CATHETERS) ×3 IMPLANT
DEVICE RAD TR BAND REGULAR (VASCULAR PRODUCTS) ×3 IMPLANT
GLIDESHEATH SLEND SS 6F .021 (SHEATH) ×3 IMPLANT
GUIDEWIRE INQWIRE 1.5J.035X260 (WIRE) ×1 IMPLANT
INQWIRE 1.5J .035X260CM (WIRE) ×3
KIT MANI 3VAL PERCEP (MISCELLANEOUS) ×3 IMPLANT
PACK CARDIAC CATH (CUSTOM PROCEDURE TRAY) ×3 IMPLANT

## 2020-05-30 NOTE — Discharge Instructions (Signed)
Angiogram, Care After This sheet gives you information about how to care for yourself after your procedure. Your health care provider may also give you more specific instructions. If you have problems or questions, contact your health care provider. What can I expect after the procedure? After the procedure, it is common to have bruising and tenderness at the catheter insertion area. Follow these instructions at home: Insertion site care  Follow instructions from your health care provider about how to take care of your insertion site. Make sure you: ? Wash your hands with soap and water before you change your bandage (dressing). If soap and water are not available, use hand sanitizer. ? Change your dressing as told by your health care provider. ? Leave stitches (sutures), skin glue, or adhesive strips in place. These skin closures may need to stay in place for 2 weeks or longer. If adhesive strip edges start to loosen and curl up, you may trim the loose edges. Do not remove adhesive strips completely unless your health care provider tells you to do that.  Do not take baths, swim, or use a hot tub until your health care provider approves.  You may shower 24-48 hours after the procedure or as told by your health care provider. ? Gently wash the site with plain soap and water. ? Pat the area dry with a clean towel. ? Do not rub the site. This may cause bleeding.  Do not apply powder or lotion to the site. Keep the site clean and dry.  Check your insertion site every day for signs of infection. Check for: ? Redness, swelling, or pain. ? Fluid or blood. ? Warmth. ? Pus or a bad smell. Activity  Rest as told by your health care provider, usually for 1-2 days.  Do not lift anything that is heavier than 10 lbs. (4.5 kg) or as told by your health care provider.  Do not drive for 24 hours if you were given a medicine to help you relax (sedative).  Do not drive or use heavy machinery while  taking prescription pain medicine. General instructions   Return to your normal activities as told by your health care provider, usually in about a week. Ask your health care provider what activities are safe for you.  If the catheter site starts bleeding, lie flat and put pressure on the site. If the bleeding does not stop, get help right away. This is a medical emergency.  Drink enough fluid to keep your urine clear or pale yellow. This helps flush the contrast dye from your body.  Take over-the-counter and prescription medicines only as told by your health care provider.  Keep all follow-up visits as told by your health care provider. This is important. Contact a health care provider if:  You have a fever or chills.  You have redness, swelling, or pain around your insertion site.  You have fluid or blood coming from your insertion site.  The insertion site feels warm to the touch.  You have pus or a bad smell coming from your insertion site.  You have bruising around the insertion site.  You notice blood collecting in the tissue around the catheter site (hematoma). The hematoma may be painful to the touch. Get help right away if:  You have severe pain at the catheter insertion area.  The catheter insertion area swells very fast.  The catheter insertion area is bleeding, and the bleeding does not stop when you hold steady pressure on the area.    The area near or just beyond the catheter insertion site becomes pale, cool, tingly, or numb. These symptoms may represent a serious problem that is an emergency. Do not wait to see if the symptoms will go away. Get medical help right away. Call your local emergency services (911 in the [place] Do not drive yourself to the hospital. Summary  After the procedure, it is common to have bruising and tenderness at the catheter insertion area.  After the procedure, it is important to rest and drink plenty of fluids.  Do not take baths,  swim, or use a hot tub until your health care provider says it is okay to do so. You may shower 24-48 hours after the procedure or as told by your health care provider.  If the catheter site starts bleeding, lie flat and put pressure on the site. If the bleeding does not stop, get help right away. This is a medical emergency. This information is not intended to replace advice given to you by your health care provider. Make sure you discuss any questions you have with your health care provider. Document Revised: 10/01/2017 Document Reviewed: 09/23/2016 Elsevier Patient Education  2020 [employer]  

## 2021-01-28 ENCOUNTER — Other Ambulatory Visit: Payer: Self-pay | Admitting: Obstetrics & Gynecology

## 2021-01-28 DIAGNOSIS — Z1231 Encounter for screening mammogram for malignant neoplasm of breast: Secondary | ICD-10-CM

## 2021-05-08 ENCOUNTER — Other Ambulatory Visit: Payer: Self-pay

## 2021-05-08 ENCOUNTER — Ambulatory Visit
Admission: RE | Admit: 2021-05-08 | Discharge: 2021-05-08 | Disposition: A | Discharge status: Home / Self Care | Payer: BC Managed Care – PPO | Source: Ambulatory Visit | Attending: Obstetrics & Gynecology | Admitting: Obstetrics & Gynecology

## 2021-05-08 DIAGNOSIS — Z1231 Encounter for screening mammogram for malignant neoplasm of breast: Secondary | ICD-10-CM | POA: Diagnosis not present

## 2022-02-25 ENCOUNTER — Other Ambulatory Visit: Payer: Self-pay | Admitting: Obstetrics and Gynecology

## 2022-02-25 DIAGNOSIS — Z1231 Encounter for screening mammogram for malignant neoplasm of breast: Secondary | ICD-10-CM

## 2022-05-12 ENCOUNTER — Ambulatory Visit
Admission: RE | Admit: 2022-05-12 | Discharge: 2022-05-12 | Disposition: A | Discharge status: Home / Self Care | Payer: BC Managed Care – PPO | Source: Ambulatory Visit | Attending: Obstetrics and Gynecology | Admitting: Obstetrics and Gynecology

## 2022-05-12 DIAGNOSIS — Z1231 Encounter for screening mammogram for malignant neoplasm of breast: Secondary | ICD-10-CM | POA: Insufficient documentation

## 2023-03-01 ENCOUNTER — Other Ambulatory Visit: Payer: Self-pay | Admitting: Obstetrics and Gynecology

## 2023-03-01 DIAGNOSIS — Z1231 Encounter for screening mammogram for malignant neoplasm of breast: Secondary | ICD-10-CM

## 2023-03-31 IMAGING — MG MM DIGITAL SCREENING BILAT W/ TOMO AND CAD
8 series · 8 of 24 positions shown · non-contrast
Comparison: Previous exam(s).

CLINICAL DATA: Screening.

EXAM:
DIGITAL SCREENING BILATERAL MAMMOGRAM WITH TOMOSYNTHESIS AND CAD
TECHNIQUE: Bilateral screening digital craniocaudal and mediolateral oblique
mammograms were obtained. Bilateral screening digital breast
tomosynthesis was performed. The images were evaluated with
computer-aided detection.

[R CC synth-2D]
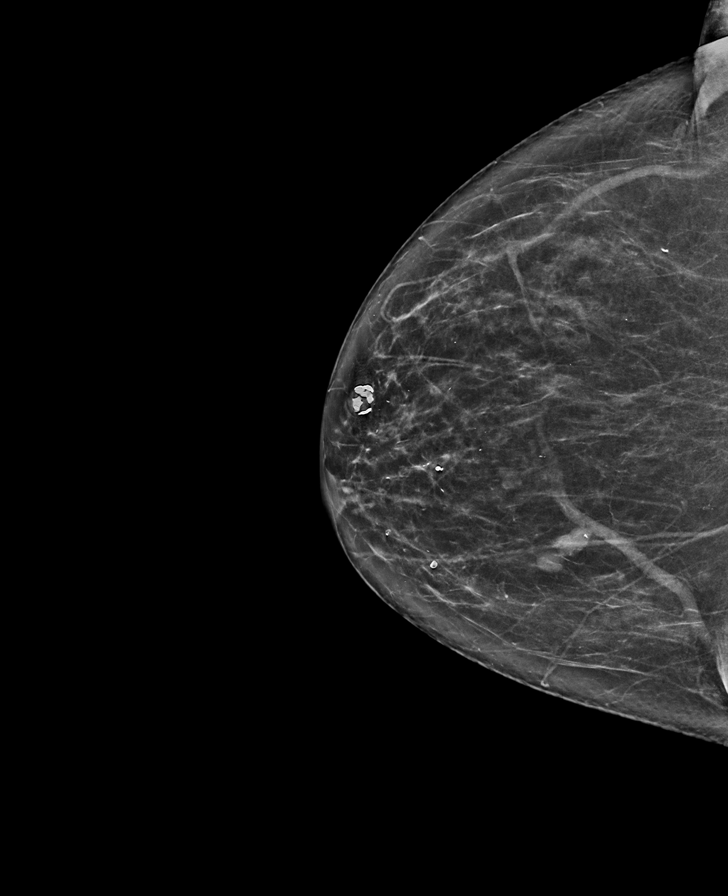

[L CC synth-2D]
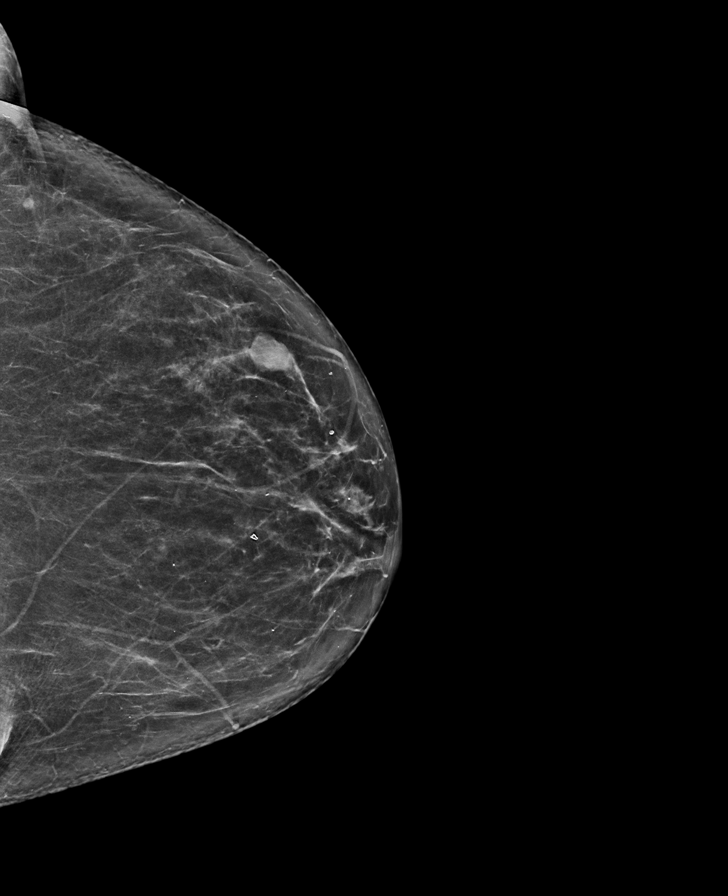

[R MLO synth-2D]
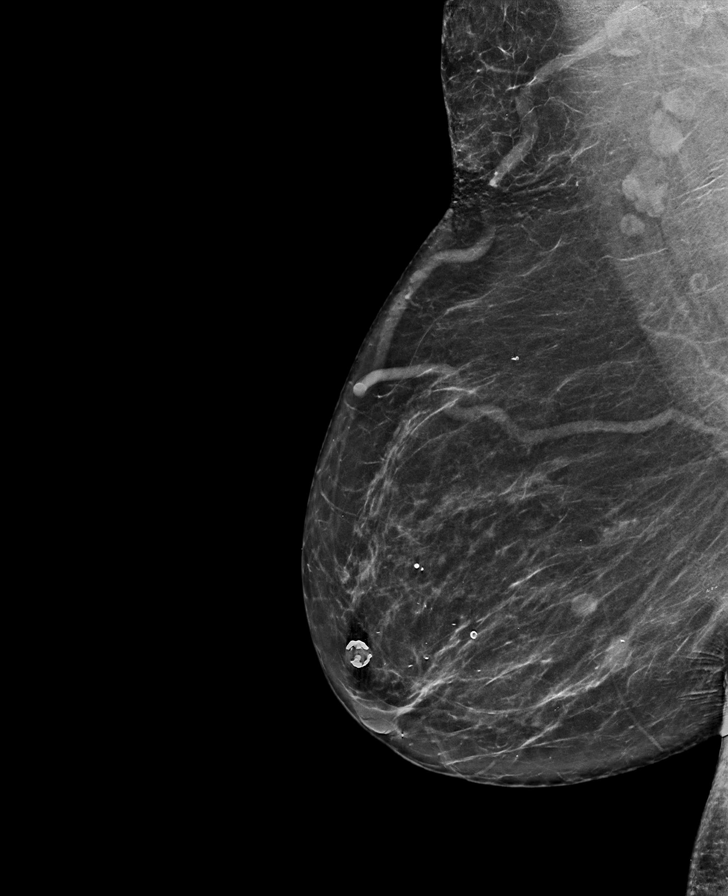

[L MLO synth-2D]
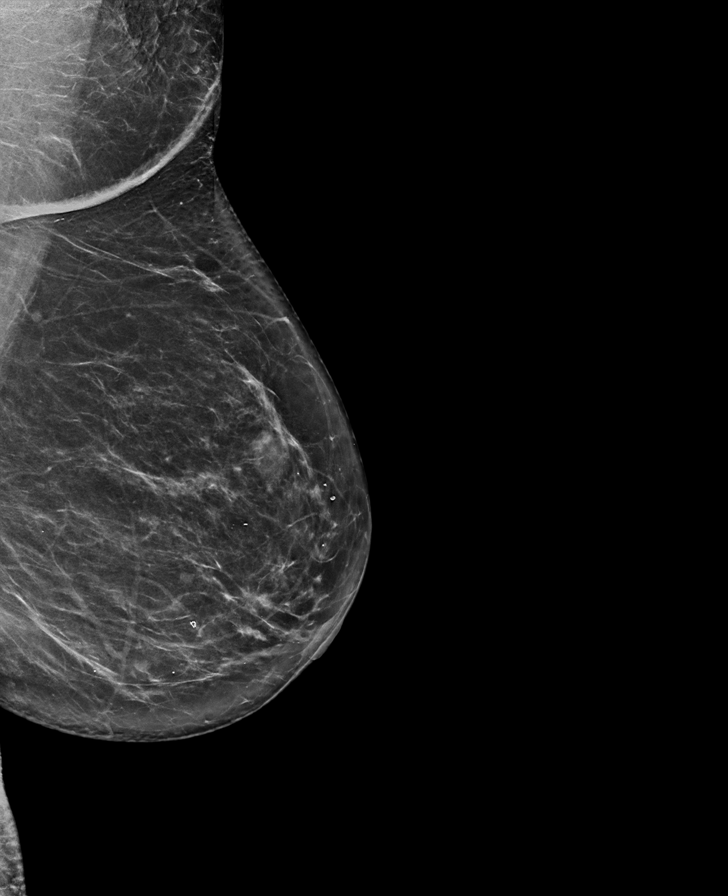

[L MLO tomo · tomo slice 41/81.0]
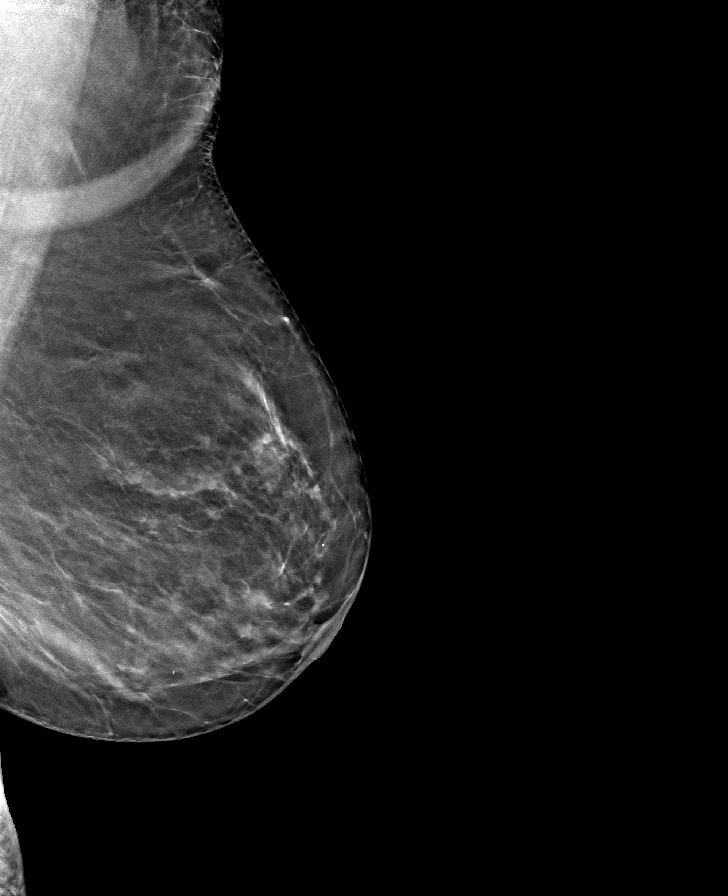

[R MLO tomo · tomo slice 39/77.0]
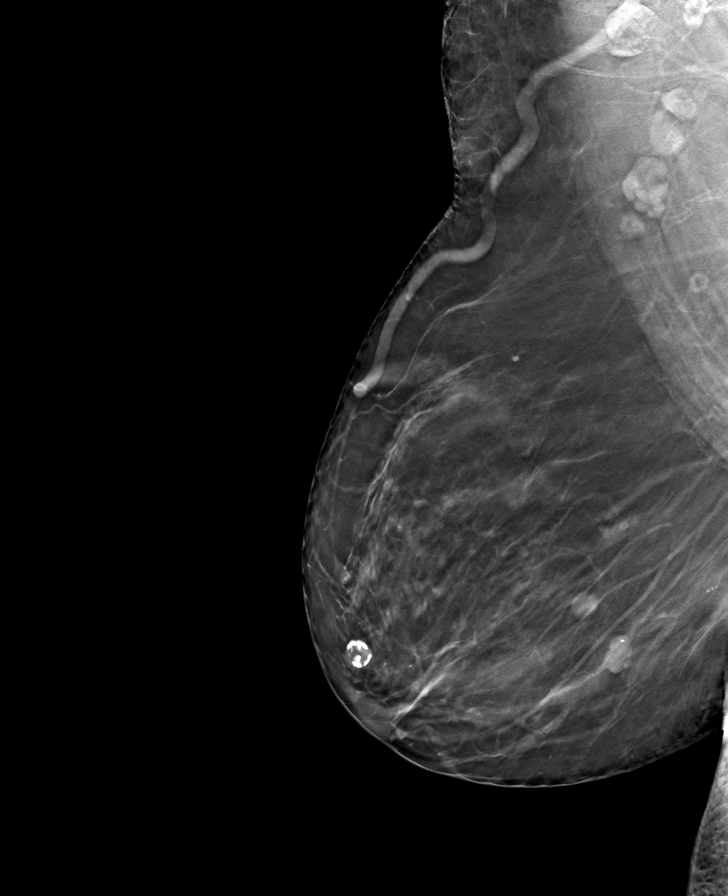

[R CC tomo · tomo slice 37/72.0]
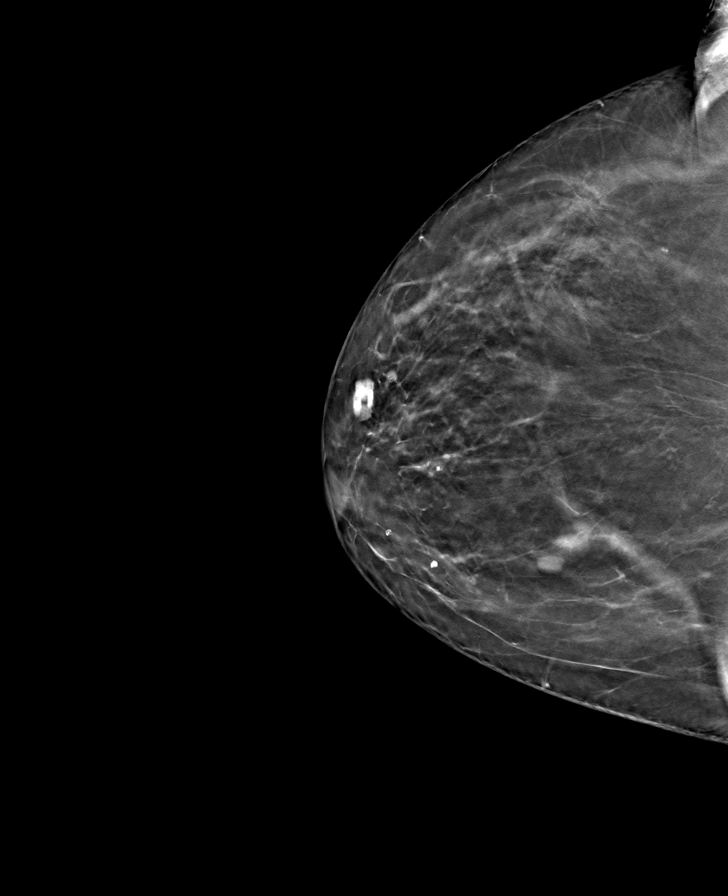

[L CC tomo · tomo slice 37/72.0]
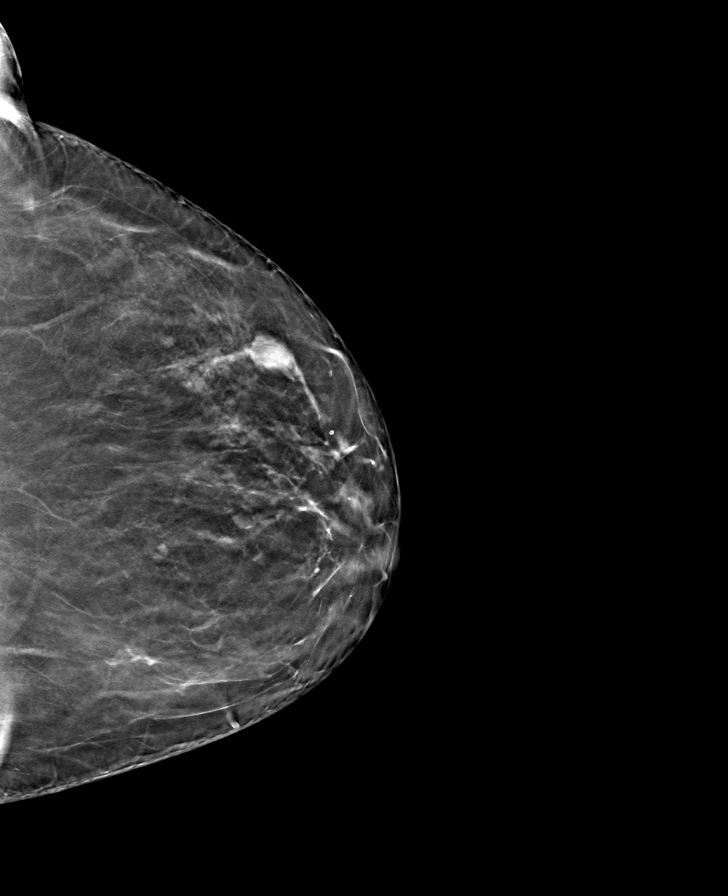

[8 of 24 positions shown; findings below may reference images not displayed]

ACR Breast Density Category b: There are scattered areas of
fibroglandular density.
FINDINGS: There are no findings suspicious for malignancy.
IMPRESSION: No mammographic evidence of malignancy. A result letter of this
screening mammogram will be mailed directly to the patient.

RECOMMENDATION:
Screening mammogram in one year. (Code:51-O-LD2)

BI-RADS CATEGORY  1: Negative.

## 2023-05-19 ENCOUNTER — Ambulatory Visit
Admission: RE | Admit: 2023-05-19 | Discharge: 2023-05-19 | Disposition: A | Discharge status: Home / Self Care | Payer: Medicare PPO | Source: Ambulatory Visit | Attending: Obstetrics and Gynecology | Admitting: Obstetrics and Gynecology

## 2023-05-19 DIAGNOSIS — Z1231 Encounter for screening mammogram for malignant neoplasm of breast: Secondary | ICD-10-CM | POA: Insufficient documentation

## 2023-11-16 ENCOUNTER — Emergency Department: Payer: Medicare PPO

## 2023-11-16 ENCOUNTER — Emergency Department
Admission: EM | Admit: 2023-11-16 | Discharge: 2023-11-16 | Disposition: A | Discharge status: Home / Self Care | Payer: Medicare PPO | Attending: Emergency Medicine | Admitting: Emergency Medicine

## 2023-11-16 ENCOUNTER — Other Ambulatory Visit: Payer: Self-pay

## 2023-11-16 DIAGNOSIS — R519 Headache, unspecified: Secondary | ICD-10-CM | POA: Diagnosis not present

## 2023-11-16 DIAGNOSIS — R55 Syncope and collapse: Secondary | ICD-10-CM | POA: Insufficient documentation

## 2023-11-16 DIAGNOSIS — I10 Essential (primary) hypertension: Secondary | ICD-10-CM | POA: Diagnosis not present

## 2023-11-16 DIAGNOSIS — E119 Type 2 diabetes mellitus without complications: Secondary | ICD-10-CM | POA: Insufficient documentation

## 2023-11-16 DIAGNOSIS — R110 Nausea: Secondary | ICD-10-CM | POA: Insufficient documentation

## 2023-11-16 LAB — CBC
HCT: 40.9 % (ref 36.0–46.0)
Hemoglobin: 13.9 g/dL (ref 12.0–15.0)
MCH: 31.2 pg (ref 26.0–34.0)
MCHC: 34 g/dL (ref 30.0–36.0)
MCV: 91.7 fL (ref 80.0–100.0)
Platelets: 256 10*3/uL (ref 150–400)
RBC: 4.46 MIL/uL (ref 3.87–5.11)
RDW: 12.6 % (ref 11.5–15.5)
WBC: 7.2 10*3/uL (ref 4.0–10.5)
nRBC: 0 % (ref 0.0–0.2)

## 2023-11-16 LAB — COMPREHENSIVE METABOLIC PANEL
ALT: 24 U/L (ref 0–44)
AST: 24 U/L (ref 15–41)
Albumin: 4.6 g/dL (ref 3.5–5.0)
Alkaline Phosphatase: 64 U/L (ref 38–126)
Anion gap: 12 (ref 5–15)
BUN: 17 mg/dL (ref 8–23)
CO2: 27 mmol/L (ref 22–32)
Calcium: 9.6 mg/dL (ref 8.9–10.3)
Chloride: 97 mmol/L — ABNORMAL LOW (ref 98–111)
Creatinine, Ser: 0.64 mg/dL (ref 0.44–1.00)
GFR, Estimated: >60 mL/min (ref >60)
Glucose, Bld: 128 mg/dL — ABNORMAL HIGH (ref 70–99)
Potassium: 3.7 mmol/L (ref 3.5–5.1)
Sodium: 136 mmol/L (ref 135–145)
Total Bilirubin: 1.1 mg/dL (ref 0.0–1.2)
Total Protein: 6.9 g/dL (ref 6.5–8.1)

## 2023-11-16 LAB — TROPONIN I (HIGH SENSITIVITY)
Troponin I (High Sensitivity): <2 ng/L (ref <18)
Troponin I (High Sensitivity): 3 ng/L (ref <18)

## 2023-11-16 NOTE — ED Notes (Signed)
 Pt verbalized understanding of discharge instructions. Opportunity for questions provided.

## 2023-11-16 NOTE — ED Provider Notes (Signed)
 [facility] Provider Note    Event Date/Time   First MD Initiated Contact with Patient 11/16/23 1615     (approximate)   History   Loss of Consciousness   HPI AEMILIA Butler is a 68 y.o. female with history of HTN, HLD, DM2 presenting today for lightheadedness.  Patient states she had an episode this morning when she got up and went to the bathroom.  While using the bathroom she felt nauseous.  She then stood up and walked over to the sink where she got lightheaded.  He reports falling down to the ground but is not sure if she fully lost consciousness.  Husband came over immediately and said she was awake and responsive.  She had some pain to the right side of her head.  Denies any neck pain.  Denies trauma elsewhere.  States that she has been asymptomatic since then.  No other episodes of lightheadedness recently.  No prior syncopal episodes.  Prior history of a left heart cath which was unremarkable with no stent placement.     Physical Exam   Triage Vital Signs: ED Triage Vitals  Encounter Vitals Group     BP 11/16/23 1159 (!) 153/68     Systolic BP Percentile --      Diastolic BP Percentile --      Pulse Rate 11/16/23 1156 77     Resp 11/16/23 1156 16     Temp 11/16/23 1156 98 F (36.7 C)     Temp src --      SpO2 11/16/23 1156 100 %     Weight 11/16/23 1157 169 lb (76.7 kg)     Height 11/16/23 1157 5' 5 (1.651 m)     Head Circumference --      Peak Flow --      Pain Score 11/16/23 1156 5     Pain Loc --      Pain Education --      Exclude from Growth Chart --     Most recent vital signs: Vitals:   11/16/23 1159 11/16/23 1540  BP: (!) 153/68 (!) 151/68  Pulse:  66  Resp:  17  Temp:    SpO2:  98%   Physical Exam: I have reviewed the vital signs and nursing notes. General: Awake, alert, no acute distress.  Nontoxic appearing. Head:  Atraumatic, normocephalic.   ENT:  EOM intact, PERRL. Oral mucosa is pink and moist with no  lesions. Neck: Neck is supple with full range of motion,  Cardiovascular:  RRR, No murmurs. Peripheral pulses palpable and equal bilaterally. Respiratory:  Symmetrical chest wall expansion.  No rhonchi, rales, or wheezes.  Good air movement throughout.  No use of accessory muscles.   Musculoskeletal:  No cyanosis or edema. Moving extremities with full ROM Abdomen:  Soft, nontender, nondistended. Neuro:  GCS 15, moving all four extremities, interacting appropriately. Speech clear.  Cranial nerves II through XII intact.  Strength 5 out of 5 bilaterally in the upper and lower extremities.  Sensation intact and equal to bilateral upper and lower extremities.  No ataxia. Psych:  Calm, appropriate.   Skin:  Warm, dry, no rash.    ED Results / Procedures / Treatments   Labs (all labs ordered are listed, but only abnormal results are displayed) Labs Reviewed  COMPREHENSIVE METABOLIC PANEL - Abnormal; Notable for the following components:      Result Value   Chloride 97 (*)    Glucose, Bld 128 (*)  All other components within normal limits  CBC  TROPONIN I (HIGH SENSITIVITY)  TROPONIN I (HIGH SENSITIVITY)     EKG My EKG interpretation: Rate of 73, normal sinus rhythm.  Left axis deviation.  No acute ST elevations or depressions   RADIOLOGY Independently interpreted CT imaging of head and C-spine with no acute pathology   PROCEDURES:  Critical Care performed: No  Procedures   MEDICATIONS ORDERED IN ED: Medications - No data to display   IMPRESSION / MDM / ASSESSMENT AND PLAN / ED COURSE  I reviewed the triage vital signs and the nursing notes.                              Differential diagnosis includes, but is not limited to, cardiac arrhythmia, orthostatic hypotension, vasovagal syncope, electrolyte abnormality, ICH, cervical spine injury  Patient's presentation is most consistent with acute complicated illness / injury requiring diagnostic workup.  Patient is a  68 year old female presenting today for near syncope versus possible complete loss of consciousness with head injury.  Vital signs stable on arrival and physical exam largely unremarkable.  No acute neurological symptoms present.  Patient asymptomatic at this time.  EKG largely unremarkable.  Troponin negative.  CBC and CMP without acute abnormalities.  CT imaging of the head and C-spine showed no acute traumatic pathology.  Patient has been able to ambulate without issue and tolerate p.o.  Symptoms seem more consistent with possible orthostatic hypotension versus possible vasovagal syncope which is now resolved at this time.  She is in the low risk category for [facility] syncope rule.  I think patient is safe for discharge at this time and follow-up with her PCP for ongoing outpatient evaluation.  She was given strict return precautions should symptoms worsen and agreeable with plan.  The patient is on the cardiac monitor to evaluate for evidence of arrhythmia and/or significant heart rate changes.     FINAL CLINICAL IMPRESSION(S) / ED DIAGNOSES   Final diagnoses:  Syncope and collapse     Rx / DC Orders   ED Discharge Orders     None        Note:  This document was prepared using Dragon voice recognition software and may include unintentional dictation errors.   Malvina Alm DASEN, MD 11/16/23 1710

## 2023-11-16 NOTE — ED Triage Notes (Signed)
 Pt to ED for LOC this am, got up and went to bathroom, felt nauseous and had syncopal episode. Hit right side of head, takes ASA. C/o pain to head Ambulatory, NAD noted

## 2023-11-16 NOTE — ED Provider Triage Note (Signed)
 Emergency Medicine Provider Triage Evaluation Note  Kathleen Butler , a 68 y.o. female  was evaluated in triage.  Pt complains of syncopal episode, nausea vomiting, no nausea now.  Review of Systems  Positive:  Negative:   Physical Exam  Pulse 77   Temp 98 F (36.7 C)   Resp 16   Ht 5' 5 (1.651 m)   Wt 76.7 kg   SpO2 100%   BMI 28.12 kg/m  Gen:   Awake, no distress   Resp:  Normal effort  MSK:   Moves extremities without difficulty  Other:    Medical Decision Making  Medically screening exam initiated at 11:59 AM.  Appropriate orders placed.  ELAH AVELLINO was informed that the remainder of the evaluation will be completed by another provider, this initial triage assessment does not replace that evaluation, and the importance of remaining in the ED until their evaluation is complete.     Gasper Devere ORN, PA-C 11/16/23 1159

## 2023-11-16 NOTE — Discharge Instructions (Signed)
 Please follow-up with your primary care provider within a week for reassessment.  You may discuss with them whether things like an ultrasound of your heart or ultrasounds of the blood vessels in your neck are warranted for further evaluation should you continue to have episodes of lightheadedness or passing out.  Please return for any severe worsening symptoms.

## 2024-03-07 ENCOUNTER — Other Ambulatory Visit: Payer: Self-pay | Admitting: Obstetrics and Gynecology

## 2024-03-07 DIAGNOSIS — Z1231 Encounter for screening mammogram for malignant neoplasm of breast: Secondary | ICD-10-CM

## 2024-05-24 ENCOUNTER — Ambulatory Visit
Admission: RE | Admit: 2024-05-24 | Discharge: 2024-05-24 | Disposition: A | Discharge status: Home / Self Care | Source: Ambulatory Visit | Attending: Obstetrics and Gynecology | Admitting: Obstetrics and Gynecology

## 2024-05-24 DIAGNOSIS — Z1231 Encounter for screening mammogram for malignant neoplasm of breast: Secondary | ICD-10-CM | POA: Diagnosis present
